# Patient Record
Sex: Male | Born: 1955 | Race: White | Hispanic: No | Marital: Married | Smoking: Never smoker
Health system: Southern US, Community
[De-identification: ages and names within clinical notes are randomized; demographics above are authoritative.]

## PROBLEM LIST (undated history)

## (undated) DIAGNOSIS — E785 Hyperlipidemia, unspecified: Secondary | ICD-10-CM

## (undated) DIAGNOSIS — I10 Essential (primary) hypertension: Secondary | ICD-10-CM

## (undated) DIAGNOSIS — F411 Generalized anxiety disorder: Secondary | ICD-10-CM

## (undated) DIAGNOSIS — G250 Essential tremor: Secondary | ICD-10-CM

## (undated) HISTORY — DX: Essential (primary) hypertension: I10

## (undated) HISTORY — DX: Essential tremor: G25.0

## (undated) HISTORY — DX: Generalized anxiety disorder: F41.1

## (undated) HISTORY — PX: BASAL CELL CARCINOMA EXCISION: SHX1214

## (undated) HISTORY — DX: Hyperlipidemia, unspecified: E78.5

---

## 2002-10-29 ENCOUNTER — Encounter: Payer: Self-pay | Admitting: Family Medicine

## 2002-10-29 ENCOUNTER — Encounter: Admission: RE | Admit: 2002-10-29 | Discharge: 2002-10-29 | Payer: Self-pay | Admitting: Family Medicine

## 2003-02-16 ENCOUNTER — Encounter: Admission: RE | Admit: 2003-02-16 | Discharge: 2003-02-16 | Payer: Self-pay | Admitting: Family Medicine

## 2003-02-16 ENCOUNTER — Encounter: Payer: Self-pay | Admitting: Family Medicine

## 2013-01-26 ENCOUNTER — Other Ambulatory Visit: Payer: Self-pay | Admitting: Internal Medicine

## 2013-01-26 ENCOUNTER — Other Ambulatory Visit: Payer: Self-pay

## 2013-01-26 DIAGNOSIS — R109 Unspecified abdominal pain: Secondary | ICD-10-CM

## 2013-01-28 ENCOUNTER — Ambulatory Visit
Admission: RE | Admit: 2013-01-28 | Discharge: 2013-01-28 | Disposition: A | Discharge status: Home / Self Care | Payer: 59 | Source: Ambulatory Visit | Attending: Internal Medicine | Admitting: Internal Medicine

## 2013-01-28 DIAGNOSIS — R109 Unspecified abdominal pain: Secondary | ICD-10-CM

## 2017-01-08 DIAGNOSIS — D126 Benign neoplasm of colon, unspecified: Secondary | ICD-10-CM | POA: Diagnosis not present

## 2017-01-08 DIAGNOSIS — K635 Polyp of colon: Secondary | ICD-10-CM | POA: Diagnosis not present

## 2017-01-08 DIAGNOSIS — Z86010 Personal history of colonic polyps: Secondary | ICD-10-CM | POA: Diagnosis not present

## 2017-04-10 DIAGNOSIS — D696 Thrombocytopenia, unspecified: Secondary | ICD-10-CM | POA: Diagnosis not present

## 2017-04-10 DIAGNOSIS — D4989 Neoplasm of unspecified behavior of other specified sites: Secondary | ICD-10-CM | POA: Diagnosis not present

## 2017-05-24 DIAGNOSIS — Z85828 Personal history of other malignant neoplasm of skin: Secondary | ICD-10-CM | POA: Diagnosis not present

## 2017-05-24 DIAGNOSIS — L821 Other seborrheic keratosis: Secondary | ICD-10-CM | POA: Diagnosis not present

## 2017-05-24 DIAGNOSIS — L812 Freckles: Secondary | ICD-10-CM | POA: Diagnosis not present

## 2017-05-24 DIAGNOSIS — L858 Other specified epidermal thickening: Secondary | ICD-10-CM | POA: Diagnosis not present

## 2017-06-24 DIAGNOSIS — T24222A Burn of second degree of left knee, initial encounter: Secondary | ICD-10-CM | POA: Diagnosis not present

## 2017-06-24 DIAGNOSIS — T22211A Burn of second degree of right forearm, initial encounter: Secondary | ICD-10-CM | POA: Diagnosis not present

## 2017-07-29 DIAGNOSIS — Z0000 Encounter for general adult medical examination without abnormal findings: Secondary | ICD-10-CM | POA: Diagnosis not present

## 2017-07-29 DIAGNOSIS — Z125 Encounter for screening for malignant neoplasm of prostate: Secondary | ICD-10-CM | POA: Diagnosis not present

## 2017-08-01 DIAGNOSIS — Z0001 Encounter for general adult medical examination with abnormal findings: Secondary | ICD-10-CM | POA: Diagnosis not present

## 2018-05-29 DIAGNOSIS — B370 Candidal stomatitis: Secondary | ICD-10-CM | POA: Diagnosis not present

## 2018-05-29 DIAGNOSIS — K120 Recurrent oral aphthae: Secondary | ICD-10-CM | POA: Diagnosis not present

## 2018-07-10 DIAGNOSIS — Z85828 Personal history of other malignant neoplasm of skin: Secondary | ICD-10-CM | POA: Diagnosis not present

## 2018-07-10 DIAGNOSIS — D225 Melanocytic nevi of trunk: Secondary | ICD-10-CM | POA: Diagnosis not present

## 2018-07-10 DIAGNOSIS — L821 Other seborrheic keratosis: Secondary | ICD-10-CM | POA: Diagnosis not present

## 2018-07-10 DIAGNOSIS — L820 Inflamed seborrheic keratosis: Secondary | ICD-10-CM | POA: Diagnosis not present

## 2018-07-31 DIAGNOSIS — Z0000 Encounter for general adult medical examination without abnormal findings: Secondary | ICD-10-CM | POA: Diagnosis not present

## 2018-07-31 DIAGNOSIS — Z125 Encounter for screening for malignant neoplasm of prostate: Secondary | ICD-10-CM | POA: Diagnosis not present

## 2018-07-31 DIAGNOSIS — K120 Recurrent oral aphthae: Secondary | ICD-10-CM | POA: Diagnosis not present

## 2018-08-04 DIAGNOSIS — Z23 Encounter for immunization: Secondary | ICD-10-CM | POA: Diagnosis not present

## 2018-08-04 DIAGNOSIS — Z0000 Encounter for general adult medical examination without abnormal findings: Secondary | ICD-10-CM | POA: Diagnosis not present

## 2021-01-31 ENCOUNTER — Other Ambulatory Visit: Payer: Self-pay | Admitting: Internal Medicine

## 2021-02-01 ENCOUNTER — Telehealth: Payer: Self-pay

## 2021-02-01 NOTE — Telephone Encounter (Signed)
NOTES ON FILE FROM [facility] [phone], SENT REFERRAL TO SCHEDULING 

## 2021-02-02 ENCOUNTER — Other Ambulatory Visit: Payer: Self-pay | Admitting: Internal Medicine

## 2021-02-02 DIAGNOSIS — R079 Chest pain, unspecified: Secondary | ICD-10-CM

## 2021-02-28 ENCOUNTER — Ambulatory Visit
Admission: RE | Admit: 2021-02-28 | Discharge: 2021-02-28 | Disposition: A | Discharge status: Home / Self Care | Payer: No Typology Code available for payment source | Source: Ambulatory Visit | Attending: Internal Medicine | Admitting: Internal Medicine

## 2021-02-28 DIAGNOSIS — R079 Chest pain, unspecified: Secondary | ICD-10-CM

## 2021-04-07 ENCOUNTER — Ambulatory Visit: Payer: Self-pay | Admitting: Cardiology

## 2021-04-19 ENCOUNTER — Ambulatory Visit: Payer: 59 | Admitting: Podiatry

## 2021-04-19 ENCOUNTER — Other Ambulatory Visit: Payer: Self-pay

## 2021-04-19 DIAGNOSIS — M79675 Pain in left toe(s): Secondary | ICD-10-CM

## 2021-04-19 DIAGNOSIS — B351 Tinea unguium: Secondary | ICD-10-CM

## 2021-04-19 DIAGNOSIS — M2142 Flat foot [pes planus] (acquired), left foot: Secondary | ICD-10-CM

## 2021-04-19 DIAGNOSIS — M79674 Pain in right toe(s): Secondary | ICD-10-CM

## 2021-04-19 DIAGNOSIS — M2141 Flat foot [pes planus] (acquired), right foot: Secondary | ICD-10-CM | POA: Diagnosis not present

## 2021-04-19 MED ORDER — TERBINAFINE HCL 250 MG PO TABS: 250.0000 mg | ORAL_TABLET | Freq: Every day | ORAL | 0 refills | Status: DC

## 2021-04-19 NOTE — Progress Notes (Signed)
   SUBJECTIVE Patient presents to office today complaining of elongated, thickened nails that cause pain while ambulating in shoes.  Patient is unable to trim their own nails.   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  He states that he has worn arch supports off and on for his entire life.  He states that he has very narrow heels and flatfeet which makes shoes difficult.  Patient is here for further evaluation and treatment.  No past medical history on file.  OBJECTIVE General Patient is awake, alert, and oriented x 3 and in no acute distress. Derm Skin is dry and supple bilateral. Negative open lesions or macerations. Remaining integument unremarkable. Nails are tender, long, thickened and dystrophic with subungual debris, consistent with onychomycosis, 1-5 bilateral. No signs of infection noted. Vasc  DP and PT pedal pulses palpable bilaterally. Temperature gradient within normal limits.  Neuro Epicritic and protective threshold sensation grossly intact bilaterally.  Musculoskeletal Exam No symptomatic pedal deformities noted bilateral. Muscular strength within normal limits.  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  Pain due to onychomycosis of toenails both 2.  Pes planus bilateral  PLAN OF CARE 1. Patient evaluated today.  2. Instructed to maintain good pedal hygiene and foot care.  3. Mechanical debridement of nails 1-5 bilaterally performed using a nail nipper. Filed with dremel without incident.  4.  Today we discussed different treatment options including oral, topical, and laser antifungal treatment modalities for onychomycosis of the toenails.  The patient's sister took Lamisil with really good results.  He would like to proceed with oral Lamisil.  He denies a history of liver pathology or symptoms  5.  Prescription for Lamisil 2 and 50 mg #90 daily  6.  Regarding the chronic flatfeet  and pes planus, recommend custom molded orthotics.  Patient is going to check with his insurance to see if they cover orthotics.  If they do he will call and to the office for an appointment with our Pedorthist  7.  Return to clinic as needed   Edrick Kins, DPM [facility]  Dr. Edrick Kins, DPM    [address], [place] [address]                Office [phone]  Fax [phone]

## 2021-05-26 NOTE — Telephone Encounter (Signed)
Pt is scheduled to see Dr. Percival Spanish on 06/20/21. Notes have been faxed over to the [facility] office. Fax confirmation received.

## 2021-06-18 DIAGNOSIS — R079 Chest pain, unspecified: Secondary | ICD-10-CM | POA: Insufficient documentation

## 2021-06-18 DIAGNOSIS — R072 Precordial pain: Secondary | ICD-10-CM | POA: Insufficient documentation

## 2021-06-18 DIAGNOSIS — R931 Abnormal findings on diagnostic imaging of heart and coronary circulation: Secondary | ICD-10-CM | POA: Insufficient documentation

## 2021-06-18 NOTE — Progress Notes (Signed)
Cardiology Office Note   Date:  06/20/2021   ID:  Noah Bradley, DOB [birth year], MRN [id number]  PCP:  Deland Pretty, MD  Cardiologist:   None Referring:  Deland Pretty, MD  Chief Complaint  Patient presents with   Elevated Coronary Calcium       History of Present Illness: Noah Bradley is a 65 y.o. male who presents for evaluation of exertional chest pain.    He is referred by Deland Pretty, MD  He had a coronary calcium score of 513 in May.  This was 85%.  This followed an episode of chest discomfort.  This happened in early May.  He was 2 out of 10 in intensity.  It lasted for about 12 hours.  He felt like a muscle pull and he has had this in his neck before.  There were no associated symptoms such as nausea vomiting or diaphoresis.  He was not short of breath.  He had no PND or orthopnea.  He did not have any palpitations, presyncope or syncope.  He was finally convinced to get a calcium score.  However, since that event he is absolutely had no symptoms other than some very mild shortness of breath with significant physical activity.  He does not have any chest pressure, neck or arm discomfort.  He does yard work.  He goes to the gym and gets on the elliptical.   Past Medical History:  Diagnosis Date   Essential tremor    GAD (generalized anxiety disorder)    HTN (hypertension)    Hyperlipemia     Past Surgical History:  Procedure Laterality Date   BASAL CELL CARCINOMA EXCISION       Current Outpatient Medications  Medication Sig Dispense Refill   buPROPion (WELLBUTRIN XL) 300 MG 24 hr tablet Take 300 mg by mouth daily.     cetirizine (ZYRTEC) 10 MG tablet 1 tablet     clonazePAM (KLONOPIN) 0.5 MG tablet Take 0.75 mg by mouth 2 (two) times daily.     esomeprazole (NEXIUM) 40 MG capsule 1 capsule     fluticasone (FLONASE) 50 MCG/ACT nasal spray 1 spray in each nostril     IBU 600 MG tablet Take 600 mg by mouth 3 (three) times daily as needed.     sertraline  (ZOLOFT) 100 MG tablet Take 100 mg by mouth daily.     tadalafil (CIALIS) 20 MG tablet 1 tablet     telmisartan-hydrochlorothiazide (MICARDIS HCT) 40-12.5 MG tablet Take 1 tablet by mouth daily.     terbinafine (LAMISIL) 250 MG tablet Take 1 tablet (250 mg total) by mouth daily. 90 tablet 0   traZODone (DESYREL) 100 MG tablet 0.5 to 1 tablet at bedtime as needed     No current facility-administered medications for this visit.    Allergies:   Patient has no allergy information on record.    Social History:  The patient  reports that he has never smoked. He has never used smokeless tobacco.   Family History:  The patient's family history includes Cancer in his father; Lung cancer in his mother.    ROS:  Please see the history of present illness.   Otherwise, review of systems are positive for none.   All other systems are reviewed and negative.    PHYSICAL EXAM: VS:  BP 128/64   Pulse 62   Ht '6\' 4"'$  (1.93 m)   Wt 257 lb (116.6 kg)   SpO2 95%   BMI  31.28 kg/m  , BMI Body mass index is 31.28 kg/m. GENERAL:  Well appearing HEENT:  Pupils equal round and reactive, fundi not visualized, oral mucosa unremarkable NECK:  No jugular venous distention, waveform within normal limits, carotid upstroke brisk and symmetric, no bruits, no thyromegaly LYMPHATICS:  No cervical, inguinal adenopathy LUNGS:  Clear to auscultation bilaterally BACK:  No CVA tenderness CHEST:  Unremarkable HEART:  PMI not displaced or sustained,S1 and S2 within normal limits, no S3, no S4, no clicks, no rubs, no murmurs ABD:  Flat, positive bowel sounds normal in frequency in pitch, no bruits, no rebound, no guarding, no midline pulsatile mass, no hepatomegaly, no splenomegaly EXT:  2 plus pulses throughout, no edema, no cyanosis no clubbing SKIN:  No rashes no nodules NEURO:  Cranial nerves II through XII grossly intact, motor grossly intact throughout, resting tremor PSYCH:  Cognitively intact, oriented to person  place and time    EKG:  EKG is ordered today. The ekg ordered today demonstrates sinus rhythm, rate 62, axis within normal, intervals within normal limits, no acute ST-T wave changes.   Recent Labs: No results found for requested labs within last 8760 hours.    Lipid Panel No results found for: CHOL, TRIG, HDL, CHOLHDL, VLDL, LDLCALC, LDLDIRECT    Wt Readings from Last 3 Encounters:  06/20/21 257 lb (116.6 kg)      Other studies Reviewed: Additional studies/ records that were reviewed today include: Labs. Review of the above records demonstrates:  Please see elsewhere in the note.     ASSESSMENT AND PLAN:  ELEVATED CORONARY CALCIUM: He is MESA score is 18.  We had a long conversation about this.  I agree his statin is indicated.  He has some shortness of breath but no other symptoms.  I will bring the patient back for a POET (Plain Old Exercise Test). This will allow me to screen for obstructive coronary disease, risk stratify and very importantly provide a prescription for exercise.   DYSLIPIDEMIA: We had a long discussion about this.  He is going to go home and watch and [employer] fast streamer.  I have suggested a plant-based Mediterranean diet.  As above I agree that a statin is indicated but he probably wants to try diet first.  He does need to get a repeat lipid profile in no longer than 6 months with a goal LDL in my mind less than 70 and HDL greater than 50.  Current medicines are reviewed at length with the patient today.  The patient does not have concerns regarding medicines.  The following changes have been made:  no change  Labs/ tests ordered today include:   Orders Placed This Encounter  Procedures   Cardiac Stress Test: Informed Consent Details: Physician/Practitioner Attestation; Transcribe to consent form and obtain patient signature   EXERCISE TOLERANCE TEST (ETT)   EKG 12-Lead      Disposition:   FU with me in one year.     Signed, Minus Breeding, MD  06/20/2021 3:28 PM    [facility]

## 2021-06-20 ENCOUNTER — Encounter: Payer: Self-pay | Admitting: Cardiology

## 2021-06-20 ENCOUNTER — Ambulatory Visit: Payer: 59 | Admitting: Cardiology

## 2021-06-20 ENCOUNTER — Other Ambulatory Visit: Payer: Self-pay

## 2021-06-20 VITALS — BP 128/64 | HR 62 | Ht 76.0 in | Wt 257.0 lb

## 2021-06-20 DIAGNOSIS — R931 Abnormal findings on diagnostic imaging of heart and coronary circulation: Secondary | ICD-10-CM | POA: Diagnosis not present

## 2021-06-20 DIAGNOSIS — R0602 Shortness of breath: Secondary | ICD-10-CM | POA: Insufficient documentation

## 2021-06-20 DIAGNOSIS — R072 Precordial pain: Secondary | ICD-10-CM

## 2021-06-20 NOTE — Patient Instructions (Signed)
Medication Instructions:  Your physician recommends that you continue on your current medications as directed. Please refer to the Current Medication list given to you today.  Testing/Procedures: Your physician has requested that you have an exercise tolerance test. For further information please visit [email or web address]. Please also follow instruction sheet, as given.  Follow-Up: At [facility], you and your health needs are our priority.  As part of our continuing mission to provide you with exceptional heart care, we have created designated Provider Care Teams.  These Care Teams include your primary Cardiologist (physician) and Advanced Practice Providers (APPs -  Physician Assistants and Nurse Practitioners) who all work together to provide you with the care you need, when you need it.  We recommend signing up for the patient portal called "MyChart".  Sign up information is provided on this After Visit Summary.  MyChart is used to connect with patients for Virtual Visits (Telemedicine).  Patients are able to view lab/test results, encounter notes, upcoming appointments, etc.  Non-urgent messages can be sent to your provider as well.   To learn more about what you can do with MyChart, go to [email or web address].    Your next appointment:   12 month(s)  The format for your next appointment:   In Person  Provider:   Minus Breeding, MD   Other Instructions Game Changer-plant based diet

## 2021-07-05 ENCOUNTER — Telehealth (HOSPITAL_COMMUNITY): Payer: Self-pay | Admitting: *Deleted

## 2021-07-05 NOTE — Telephone Encounter (Signed)
Close encounter 

## 2021-07-06 ENCOUNTER — Ambulatory Visit (HOSPITAL_COMMUNITY)
Admission: RE | Admit: 2021-07-06 | Discharge: 2021-07-06 | Disposition: A | Discharge status: Home / Self Care | Payer: 59 | Source: Ambulatory Visit | Attending: Cardiology | Admitting: Cardiology

## 2021-07-06 ENCOUNTER — Other Ambulatory Visit: Payer: Self-pay

## 2021-07-06 DIAGNOSIS — R0602 Shortness of breath: Secondary | ICD-10-CM | POA: Insufficient documentation

## 2021-07-06 DIAGNOSIS — R931 Abnormal findings on diagnostic imaging of heart and coronary circulation: Secondary | ICD-10-CM | POA: Diagnosis present

## 2021-07-06 LAB — EXERCISE TOLERANCE TEST
Angina Index: 0
Base ST Depression (mm): 0 mm
Duke Treadmill Score: 3
Estimated workload: 10.1
Exercise duration (min): 8 min
Exercise duration (sec): 11 s
MPHR: 141 {beats}/min
Peak HR: 141 {beats}/min
Percent HR: 90 %
Rest HR: 72 {beats}/min
ST Depression (mm): 1 mm

## 2021-07-13 ENCOUNTER — Encounter: Payer: Self-pay | Admitting: *Deleted

## 2021-07-13 NOTE — Telephone Encounter (Addendum)
-----   Message from Minus Breeding, MD sent at 07/09/2021 10:45 AM EDT ----- He had hypertensive response to exercise.  In addition he had some ST changes which could indicate that some of the blockages suggested by a calcium in his vessels might be obstructive.  Given this the next step should be coronary CTA.  Please arrange and schedule follow-up after this testing.  Call   [place] with the results and send results to Deland Pretty, MD  Left message for pt to call

## 2021-07-17 NOTE — Telephone Encounter (Signed)
Left message for pt to call.

## 2021-07-18 ENCOUNTER — Encounter: Payer: Self-pay | Admitting: *Deleted

## 2021-07-18 ENCOUNTER — Telehealth: Payer: Self-pay | Admitting: *Deleted

## 2021-07-18 DIAGNOSIS — R9431 Abnormal electrocardiogram [ECG] [EKG]: Secondary | ICD-10-CM

## 2021-07-18 MED ORDER — METOPROLOL TARTRATE 100 MG PO TABS: ORAL_TABLET | ORAL | 0 refills | Status: DC

## 2021-07-18 NOTE — Telephone Encounter (Signed)
This encounter was created in error - please disregard.

## 2021-07-18 NOTE — Telephone Encounter (Signed)
-----   Message from Minus Breeding, MD sent at 07/09/2021 10:45 AM EDT ----- He had hypertensive response to exercise.  In addition he had some ST changes which could indicate that some of the blockages suggested by a calcium in his vessels might be obstructive.  Given this the next step should be coronary CTA.  Please arrange and schedule follow-up after this testing.  Call   [place]ner with the results and send results to Deland Pretty, MD

## 2021-07-18 NOTE — Telephone Encounter (Signed)
Spoke with pt, aware of results. Order placed for CTA. Lab orders mailed to the pt. Instructions sent to patient via my chart. Left message for pt to call with allergies.

## 2021-07-19 ENCOUNTER — Encounter: Payer: Self-pay | Admitting: *Deleted

## 2021-07-21 LAB — BASIC METABOLIC PANEL
BUN/Creatinine Ratio: 23 (ref 10–24)
BUN: 25 mg/dL (ref 8–27)
CO2: 26 mmol/L (ref 20–29)
Calcium: 9.1 mg/dL (ref 8.6–10.2)
Chloride: 104 mmol/L (ref 96–106)
Creatinine, Ser: 1.1 mg/dL (ref 0.76–1.27)
Glucose: 116 mg/dL — ABNORMAL HIGH (ref 70–99)
Potassium: 4.3 mmol/L (ref 3.5–5.2)
Sodium: 142 mmol/L (ref 134–144)
eGFR: 75 mL/min/{1.73_m2} (ref >59)

## 2021-07-25 ENCOUNTER — Telehealth (HOSPITAL_COMMUNITY): Payer: Self-pay | Admitting: Emergency Medicine

## 2021-07-25 ENCOUNTER — Telehealth (HOSPITAL_COMMUNITY): Payer: Self-pay | Admitting: *Deleted

## 2021-07-25 NOTE — Telephone Encounter (Signed)
Attempted to call patient regarding upcoming cardiac CT appointment. °Left message on voicemail with name and callback number °Silvanna Ohmer RN Navigator Cardiac Imaging °[facility] °[phone] Office °[phone] Cell ° °

## 2021-07-25 NOTE — Telephone Encounter (Signed)
Reaching out to patient to offer assistance regarding upcoming cardiac imaging study; pt verbalizes understanding of appt date/time, parking situation and where to check in, pre-test NPO status and medications ordered, and verified current allergies; name and call back number provided for further questions should they arise  Gordy Clement RN Navigator Cardiac [facility] [phone] office [phone] cell  Patient to take 50mg  metoprolol tartrate if HR is greater than 65bpm.  Patient aware he can take his Klonopin for test but will need a driver if he takes the [place].

## 2021-07-26 ENCOUNTER — Other Ambulatory Visit: Payer: Self-pay

## 2021-07-26 ENCOUNTER — Ambulatory Visit (HOSPITAL_COMMUNITY)
Admission: RE | Admit: 2021-07-26 | Discharge: 2021-07-26 | Disposition: A | Discharge status: Home / Self Care | Payer: 59 | Source: Ambulatory Visit | Attending: Cardiology | Admitting: Cardiology

## 2021-07-26 DIAGNOSIS — R9431 Abnormal electrocardiogram [ECG] [EKG]: Secondary | ICD-10-CM | POA: Diagnosis present

## 2021-07-26 DIAGNOSIS — R931 Abnormal findings on diagnostic imaging of heart and coronary circulation: Secondary | ICD-10-CM | POA: Diagnosis not present

## 2021-07-26 DIAGNOSIS — I2510 Atherosclerotic heart disease of native coronary artery without angina pectoris: Secondary | ICD-10-CM | POA: Diagnosis not present

## 2021-07-26 IMAGING — CT CT HEART MORP W/ CTA COR W/ SCORE W/ CA W/CM &/OR W/O CM
4 of 7 series · 8 of 20 positions shown, 9 images · IV contrast (APPLIED)
Comparison: 02/28/2021
COMPARISON: 02/28/2021

Addendum:
EXAM:
OVER-READ INTERPRETATION  CT CHEST

The following report is an over-read performed by radiologist Dr.
Abdullahel View [REDACTED] on 07/26/2021. This
over-read does not include interpretation of cardiac or coronary
anatomy or pathology. The coronary CTA interpretation by the
cardiologist is attached.
HISTORY: abnormal GXT
Cardiac/Coronary CT
TECHNIQUE: The patient was scanned on a Siemens Force scanner.
PROTOCOL: A 120 kV prospective scan was triggered in the descending thoracic
aorta at 111 HU's. Axial non-contrast 3 mm slices were carried out
through the heart. The data set was analyzed on a dedicated work
station and scored using the Agatson method. Gantry rotation speed
was 250 msecs and collimation was 0.6 mm. Heart rate optimized
medically, and 0.8 mg of sublingual nitroglycerin was given. The 3D
data set was reconstructed in 5% intervals of 35-75% of the R-R
cycle. Diastolic phases were analyzed on a dedicated work station
using MPR, MIP and VRT modes. The patient received 95mL OMNIPAQUE
IOHEXOL 350 MG/ML SOLN of contrast.

[Series 6: ts diast sharp · axial · 0.39mm/px · z∈[-99,-62]mm · 2 of 281 slices shown]
[im 94/281  lung]
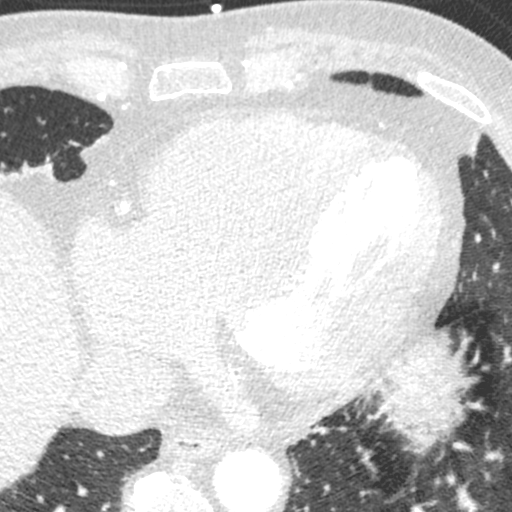
[im 187/281  lung]
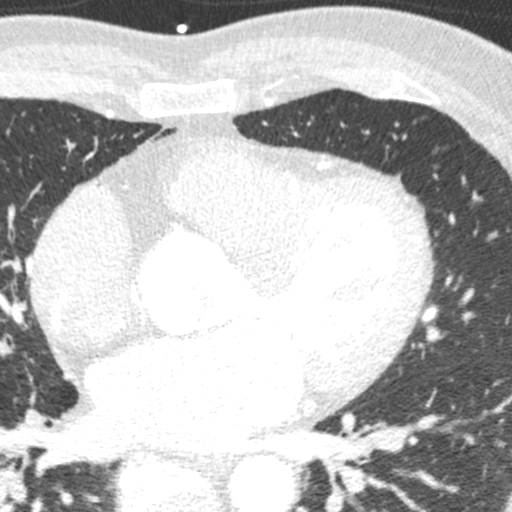

[Series 7: best diast · axial · 0.39mm/px · z∈[-99,-62]mm · 2 of 281 slices shown, 3 images]
[im 94/281  vessel]
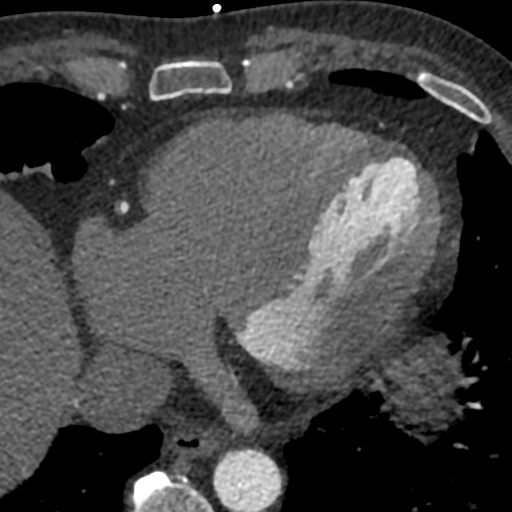
[im 94/281  lung]
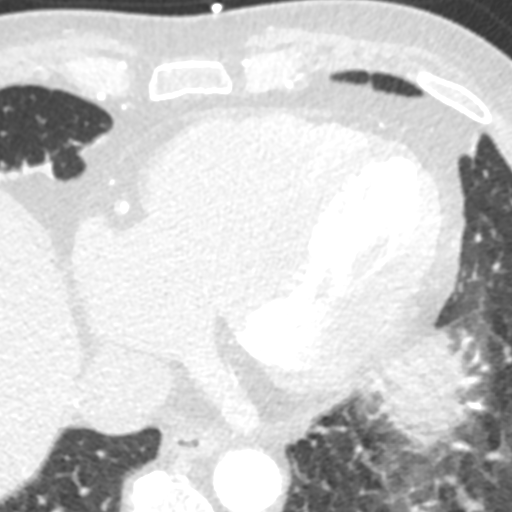
[im 187/281  vessel]
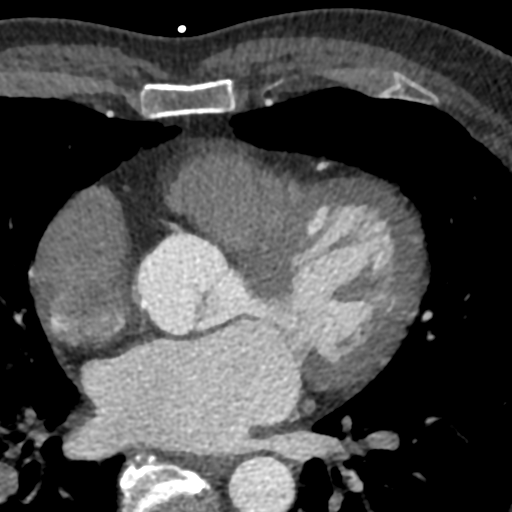

[Series 8: best syst · axial · 0.39mm/px · z∈[-99,-62]mm · 2 of 281 slices shown]
[im 94/281  vessel]
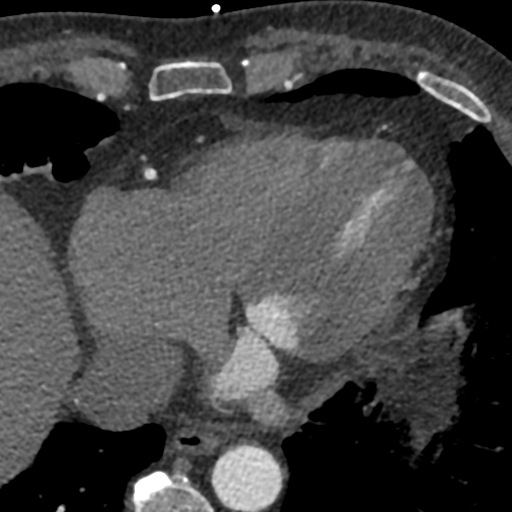
[im 187/281  vessel]
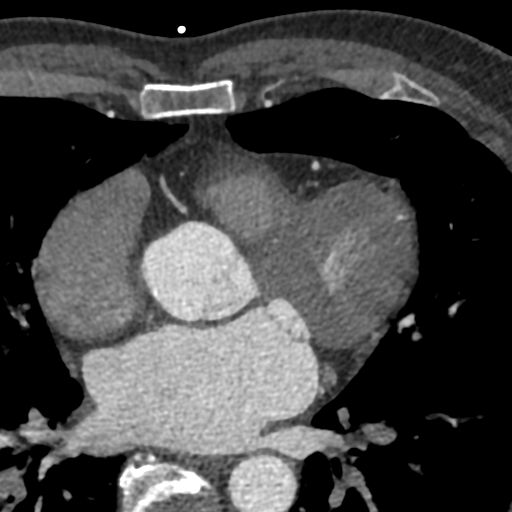

[Series 9: ts syst sharp · axial · 0.39mm/px · z∈[-99,-62]mm · 2 of 281 slices shown]
[im 94/281  lung]
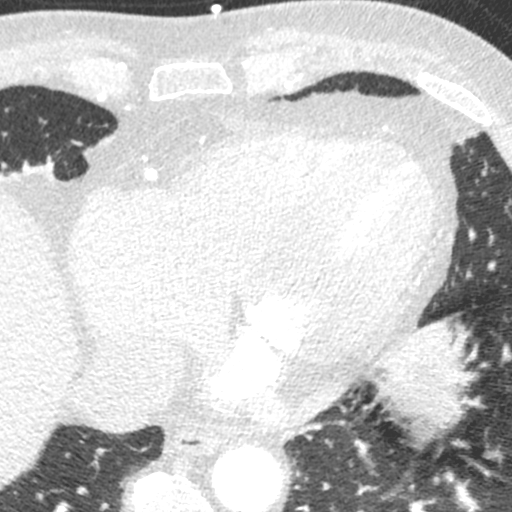
[im 187/281  lung]
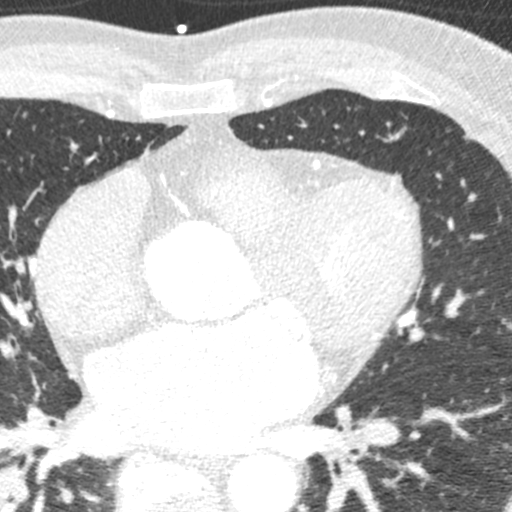

[8 of 20 positions shown; findings below may reference images not displayed]

FINDINGS: Vascular: Heart is normal size.  Aorta normal caliber.

Mediastinum/Nodes: No adenopathy

Lungs/Pleura: No confluent opacities or effusions. Linear scarring
at the lung bases.

Upper Abdomen: Imaging into the upper abdomen demonstrates no acute
findings.

Musculoskeletal: Chest wall soft tissues are unremarkable. No acute
bony abnormality.
IMPRESSION: No acute or significant extracardiac abnormality.
FINDINGS: Coronary calcium score: The patient's coronary artery calcium score
is 212, which places the patient in the 70th percentile.

Coronary arteries: Normal coronary origins.  Right dominance.

Right Coronary Artery: Normal caliber vessel, gives rise to PDA.
There is focal mixed calcified and noncalcified plaque in the
proximal RCA with 25-49% stenosis. There appears to be protrusion of
plaque into the lumen with flow around it. There is noncalcified
plaque throughout the remainder of the proximal RCA with 1-24%
stenosis. There is mixed calcified and noncalcified plaque in the
mid RCA with maximum 1-24% stenosis. There is focal mixed calcified
and noncalcified plaque in the distal LAD with 25-49% stenosis.
There is another focal noncalcified plaque in the distal RCA with
1-24% stenosis but protruding plaque into the lumen. There is also a
focal mixed calcified and noncalcified plaque just before the origin
of the PDA. This is a complex plaque with central hypoattenuation
and minimal flow, concerning for stenosis 70-99%. There does appear
to be flow in the PDA but cannot determine if this is anterograde or
from collaterals.

Left Main Coronary Artery: Normal caliber vessel. Small amount of
noncalcified plaque with <10% stenosis.

Left Anterior Descending Coronary Artery: Normal caliber vessel.
There is focal mixed calcified and noncalcified plaque in the
proximal LAD with approximately 50% stenosis. This plaque has high
risk features including spotty calcification and low attenuation
plaque. Plaque extends 19 mm. There is a separate mixed calcified
and noncalcified plaque in the mid LAD with 25-49% stenosis near the
origin of D2. Gives rise to 2 diagonal branches.

Left Circumflex Artery: Normal caliber vessel. Gives rise to medium
OM branch that further bifurcates. At proximal portion of OM, there
is a mixed calcified and noncalcified plaque with visually >70%
stenosis.

Aorta: Normal size, 37 mm at the mid ascending aorta (level of the
PA bifurcation) measured double oblique. Scattered calcifications
consistent with aortic atherosclerosis. No dissection seen in
visualized portions of the aorta.

Aortic Valve: Trivial calcifications. Trileaflet.

Other findings:

Normal pulmonary vein drainage into the left atrium.

Normal left atrial appendage without a thrombus.

Normal size of the pulmonary artery.

Normal appearance of the pericardium.
IMPRESSION: 1. Severe obstructive CAD, CADRADS = 4. CT FFR will be performed and
reported separately. Concern for obstructive disease in LAD and
especially in LCx/OM and distal RCA/PDA.

2. Coronary calcium score of 212. This was 70th percentile for age
and sex matched control.

3. Normal coronary origin with right dominance.

INTERPRETATION:

1. CAD-RADS 0: No evidence of CAD (0%). Consider non-atherosclerotic
causes of chest pain.

2. CAD-RADS 1: Minimal non-obstructive CAD (0-24%). Consider
non-atherosclerotic causes of chest pain. Consider preventive
therapy and risk factor modification.

3. CAD-RADS 2: Mild non-obstructive CAD (25-49%). Consider
non-atherosclerotic causes of chest pain. Consider preventive
therapy and risk factor modification.

4. CAD-RADS 3: Moderate stenosis (50-69%). Consider symptom-guided
anti-ischemic pharmacotherapy as well as risk factor modification
per guideline directed care. Additional analysis with CT FFR will be
submitted.

5. CAD-RADS 4: Severe stenosis. (70-99% or > 50% left main). Cardiac
catheterization or CT FFR is recommended. Consider symptom-guided
anti-ischemic pharmacotherapy as well as risk factor modification
per guideline directed care. Invasive coronary angiography
recommended with revascularization per published guideline
statements.

6. CAD-RADS 5: Total coronary occlusion (100%). Consider cardiac
catheterization or viability assessment. Consider symptom-guided
anti-ischemic pharmacotherapy as well as risk factor modification
per guideline directed care.

7. CAD-RADS N: Non-diagnostic study. Obstructive CAD can't be
excluded. Alternative evaluation is recommended.

*** End of Addendum ***
EXAM:
OVER-READ INTERPRETATION  CT CHEST

The following report is an over-read performed by radiologist Dr.
Abdullahel View [REDACTED] on 07/26/2021. This
over-read does not include interpretation of cardiac or coronary
anatomy or pathology. The coronary CTA interpretation by the
cardiologist is attached.
FINDINGS: Vascular: Heart is normal size.  Aorta normal caliber.

Mediastinum/Nodes: No adenopathy

Lungs/Pleura: No confluent opacities or effusions. Linear scarring
at the lung bases.

Upper Abdomen: Imaging into the upper abdomen demonstrates no acute
findings.

Musculoskeletal: Chest wall soft tissues are unremarkable. No acute
bony abnormality.
IMPRESSION: No acute or significant extracardiac abnormality.

## 2021-07-26 MED ORDER — NITROGLYCERIN 0.4 MG SL SUBL
0.8000 mg | SUBLINGUAL_TABLET | Freq: Once | SUBLINGUAL | Status: AC
  Administered 2021-07-26: 0.8 mg via SUBLINGUAL

## 2021-07-26 MED ORDER — IOHEXOL 350 MG/ML SOLN
95.0000 mL | Freq: Once | INTRAVENOUS | Status: AC | PRN
  Administered 2021-07-26: 95 mL via INTRAVENOUS

## 2021-07-26 MED ORDER — NITROGLYCERIN 0.4 MG SL SUBL
SUBLINGUAL_TABLET | SUBLINGUAL | Status: AC
  Filled 2021-07-26: qty 2

## 2021-07-27 ENCOUNTER — Ambulatory Visit (HOSPITAL_COMMUNITY)
Admission: RE | Admit: 2021-07-27 | Discharge: 2021-07-27 | Disposition: A | Discharge status: Home / Self Care | Payer: 59 | Source: Ambulatory Visit | Attending: Cardiology | Admitting: Cardiology

## 2021-07-27 ENCOUNTER — Other Ambulatory Visit (HOSPITAL_BASED_OUTPATIENT_CLINIC_OR_DEPARTMENT_OTHER): Payer: Self-pay | Admitting: Cardiology

## 2021-07-27 DIAGNOSIS — R072 Precordial pain: Secondary | ICD-10-CM | POA: Diagnosis present

## 2021-07-27 DIAGNOSIS — I2510 Atherosclerotic heart disease of native coronary artery without angina pectoris: Secondary | ICD-10-CM | POA: Diagnosis not present

## 2021-07-27 DIAGNOSIS — R931 Abnormal findings on diagnostic imaging of heart and coronary circulation: Secondary | ICD-10-CM | POA: Diagnosis not present

## 2021-07-27 NOTE — Progress Notes (Signed)
Here is the [facility] for your patient. Positive in OM and suggests severe stenosis/occlusion in the distal RCA/PDA transition

## 2021-08-01 ENCOUNTER — Other Ambulatory Visit: Payer: Self-pay | Admitting: *Deleted

## 2021-08-01 DIAGNOSIS — R072 Precordial pain: Secondary | ICD-10-CM

## 2021-08-01 MED ORDER — ASPIRIN EC 81 MG PO TBEC: 81.0000 mg | DELAYED_RELEASE_TABLET | Freq: Every day | ORAL | 3 refills | Status: AC

## 2021-08-01 MED ORDER — CLOPIDOGREL BISULFATE 75 MG PO TABS: 75.0000 mg | ORAL_TABLET | Freq: Every day | ORAL | 3 refills | Status: DC

## 2021-08-01 MED ORDER — SODIUM CHLORIDE 0.9% FLUSH: 3.0000 mL | Freq: Two times a day (BID) | INTRAVENOUS | Status: DC

## 2021-08-01 NOTE — Progress Notes (Signed)
Cardiology Office Note   Date:  08/02/2021   ID:  [name], DOB 1956-05-07, MRN [id number]  PCP:  Deland Pretty, MD  Cardiologist:   None Referring:  Deland Pretty, MD  Chief Complaint  Patient presents with   Chest Pain     History of Present Illness: Noah Bradley is a 65 y.o. adult who presents for evaluation of exertional chest pain.  He had a calcium score of 513 in May.   POET (Plain Old Exercise Treadmill) demonstrated hypertensive response to exercise with nonspecific ST changes.  Coronary CT demonstrated proximal LAD 50% stenosis with some possible high risk features.  An OM branch had greater than 70% stenosis.  Right coronary artery had a complex plaque of 70 to 99% at the origin of the PDA.  FFR suggested that the OM and RCA stenosis were most significant.  Duration.  He has not had any new discomfort.  He previously been having some sensation in his chest like a pulled muscle.  This was 2 out of 10 intensity.  It was at rest.  He has not been having any new shortness of breath, PND or orthopnea.  Not having any new palpitations, presyncope or syncope.   Past Medical History:  Diagnosis Date   Essential tremor    GAD (generalized anxiety disorder)    HTN (hypertension)    Hyperlipemia     Past Surgical History:  Procedure Laterality Date   BASAL CELL CARCINOMA EXCISION     Social History   Socioeconomic History   Marital status: Married    Spouse name: Not on file   Number of children: Not on file   Years of education: Not on file   Highest education level: Not on file  Occupational History   Not on file  Tobacco Use   Smoking status: Never   Smokeless tobacco: Never  Substance and Sexual Activity   Alcohol use: Not on file   Drug use: Not on file   Sexual activity: Not on file  Other Topics Concern   Not on file  Social History Narrative   Not on file   Social Determinants of Health   Financial Resource Strain: Not on file  Food  Insecurity: Not on file  Transportation Needs: Not on file  Physical Activity: Not on file  Stress: Not on file  Social Connections: Not on file     Family History  Problem Relation Age of Onset   Lung cancer Mother    Cancer Father       Current Outpatient Medications  Medication Sig Dispense Refill   aspirin EC 81 MG tablet Take 1 tablet (81 mg total) by mouth daily. Swallow whole. 90 tablet 3   buPROPion (WELLBUTRIN XL) 300 MG 24 hr tablet Take 300 mg by mouth daily.     cetirizine (ZYRTEC) 10 MG tablet 1 tablet     clonazePAM (KLONOPIN) 0.5 MG tablet Take 0.75 mg by mouth 2 (two) times daily.     clopidogrel (PLAVIX) 75 MG tablet Take 1 tablet (75 mg total) by mouth daily. 90 tablet 3   esomeprazole (NEXIUM) 40 MG capsule 1 capsule     fluticasone (FLONASE) 50 MCG/ACT nasal spray 1 spray in each nostril     IBU 600 MG tablet Take 600 mg by mouth 3 (three) times daily as needed.     metoprolol tartrate (LOPRESSOR) 100 MG tablet Take 2 hours prior to CT scan 1 tablet 0  rosuvastatin (CRESTOR) 20 MG tablet Take 20 mg by mouth daily.     sertraline (ZOLOFT) 100 MG tablet Take 100 mg by mouth daily.     tadalafil (CIALIS) 20 MG tablet 1 tablet     telmisartan-hydrochlorothiazide (MICARDIS HCT) 40-12.5 MG tablet Take 1 tablet by mouth daily.     terbinafine (LAMISIL) 250 MG tablet Take 1 tablet (250 mg total) by mouth daily. 90 tablet 0   traZODone (DESYREL) 100 MG tablet 0.5 to 1 tablet at bedtime as needed     triamcinolone (KENALOG) 0.1 % paste SMARTSIG:TO TEETH PRN     Current Facility-Administered Medications  Medication Dose Route Frequency Provider Last Rate Last Admin   sodium chloride flush (NS) 0.9 % injection 3 mL  3 mL Intravenous Q12H Crenshaw, Denice Bors, MD        Allergies:   Patient has no known allergies.   ROS:  Please see the history of present illness.   Otherwise, review of systems are positive for none.   All other systems are reviewed and negative.     PHYSICAL EXAM: VS:  BP 108/78   Pulse 63   Ht 6\' 5"  (1.956 m)   Wt 255 lb 6.4 oz (115.8 kg)   SpO2 97%   BMI 30.29 kg/m  , BMI Body mass index is 30.29 kg/m. GENERAL:  Well appearing NECK:  No jugular venous distention, waveform within normal limits, carotid upstroke brisk and symmetric, no bruits, no thyromegaly LUNGS:  Clear to auscultation bilaterally CHEST:  Unremarkable HEART:  PMI not displaced or sustained,S1 and S2 within normal limits, no S3, no S4, no clicks, no rubs, no murmurs ABD:  Flat, positive bowel sounds normal in frequency in pitch, no bruits, no rebound, no guarding, no midline pulsatile mass, no hepatomegaly, no splenomegaly EXT:  2 plus pulses throughout, no edema, no cyanosis no clubbing   EKG:  EKG is  ordered today. The ekg ordered today demonstrates sinus rhythm, rate 63, axis within normal, intervals within normal limits, no acute ST-T wave changes.   Recent Labs: 07/20/2021: BUN 25; Creatinine, Ser 1.10; Potassium 4.3; Sodium 142    Lipid Panel No results found for: CHOL, TRIG, HDL, CHOLHDL, VLDL, LDLCALC, LDLDIRECT    Wt Readings from Last 3 Encounters:  08/02/21 255 lb 6.4 oz (115.8 kg)  06/20/21 257 lb (116.6 kg)      Other studies Reviewed: Additional studies/ records that were reviewed today include: CT. Review of the above records demonstrates:  Please see elsewhere in the note.     ASSESSMENT AND PLAN:  CHEST PAIN/OBSTRUCTIVE CAD:   He has chest pain, shortness of breath and obstructive lesions on his coronary CT.   This would be high risk including his LAD lesion  Cardiac cath is indicated.  The patient understands that risks included but are not limited to stroke (1 in 1000), death (1 in 44), kidney failure [usually temporary] (1 in 500), bleeding (1 in 200), allergic reaction [possibly serious] (1 in 200).  The patient understands and agrees to proceed.  He has been treated with Plavix which she will pick up today at the  pharmacy.  DYSLIPIDEMIA:   He is on statin.  Goal LDL will be less than 70.    Current medicines are reviewed at length with the patient today.  The patient does not have concerns regarding medicines.  The following changes have been made:  None  Labs/ tests ordered today include:   Orders Placed This Encounter  Procedures   Basic metabolic panel   CBC   EKG 12-Lead       Disposition:   FU with me or APP after the cath.    Signed, Minus Breeding, MD  08/02/2021 5:31 PM    [place]

## 2021-08-01 NOTE — H&P (View-Only) (Signed)
Cardiology Office Note   Date:  08/02/2021   ID:  Noah Bradley, DOB 1956/02/22, MRN [id number]  PCP:  Deland Pretty, MD  Cardiologist:   None Referring:  Deland Pretty, MD  Chief Complaint  Patient presents with   Chest Pain     History of Present Illness: Noah Bradley is a 65 y.o. adult who presents for evaluation of exertional chest pain.  He had a calcium score of 513 in May.   POET (Plain Old Exercise Treadmill) demonstrated hypertensive response to exercise with nonspecific ST changes.  Coronary CT demonstrated proximal LAD 50% stenosis with some possible high risk features.  An OM branch had greater than 70% stenosis.  Right coronary artery had a complex plaque of 70 to 99% at the origin of the PDA.  FFR suggested that the OM and RCA stenosis were most significant.  Duration.  He has not had any new discomfort.  He previously been having some sensation in his chest like a pulled muscle.  This was 2 out of 10 intensity.  It was at rest.  He has not been having any new shortness of breath, PND or orthopnea.  Not having any new palpitations, presyncope or syncope.   Past Medical History:  Diagnosis Date   Essential tremor    GAD (generalized anxiety disorder)    HTN (hypertension)    Hyperlipemia     Past Surgical History:  Procedure Laterality Date   BASAL CELL CARCINOMA EXCISION     Social History   Socioeconomic History   Marital status: Married    Spouse name: Not on file   Number of children: Not on file   Years of education: Not on file   Highest education level: Not on file  Occupational History   Not on file  Tobacco Use   Smoking status: Never   Smokeless tobacco: Never  Substance and Sexual Activity   Alcohol use: Not on file   Drug use: Not on file   Sexual activity: Not on file  Other Topics Concern   Not on file  Social History Narrative   Not on file   Social Determinants of Health   Financial Resource Strain: Not on file  Food  Insecurity: Not on file  Transportation Needs: Not on file  Physical Activity: Not on file  Stress: Not on file  Social Connections: Not on file     Family History  Problem Relation Age of Onset   Lung cancer Mother    Cancer Father       Current Outpatient Medications  Medication Sig Dispense Refill   aspirin EC 81 MG tablet Take 1 tablet (81 mg total) by mouth daily. Swallow whole. 90 tablet 3   buPROPion (WELLBUTRIN XL) 300 MG 24 hr tablet Take 300 mg by mouth daily.     cetirizine (ZYRTEC) 10 MG tablet 1 tablet     clonazePAM (KLONOPIN) 0.5 MG tablet Take 0.75 mg by mouth 2 (two) times daily.     clopidogrel (PLAVIX) 75 MG tablet Take 1 tablet (75 mg total) by mouth daily. 90 tablet 3   esomeprazole (NEXIUM) 40 MG capsule 1 capsule     fluticasone (FLONASE) 50 MCG/ACT nasal spray 1 spray in each nostril     IBU 600 MG tablet Take 600 mg by mouth 3 (three) times daily as needed.     metoprolol tartrate (LOPRESSOR) 100 MG tablet Take 2 hours prior to CT scan 1 tablet 0  rosuvastatin (CRESTOR) 20 MG tablet Take 20 mg by mouth daily.     sertraline (ZOLOFT) 100 MG tablet Take 100 mg by mouth daily.     tadalafil (CIALIS) 20 MG tablet 1 tablet     telmisartan-hydrochlorothiazide (MICARDIS HCT) 40-12.5 MG tablet Take 1 tablet by mouth daily.     terbinafine (LAMISIL) 250 MG tablet Take 1 tablet (250 mg total) by mouth daily. 90 tablet 0   traZODone (DESYREL) 100 MG tablet 0.5 to 1 tablet at bedtime as needed     triamcinolone (KENALOG) 0.1 % paste SMARTSIG:TO TEETH PRN     Current Facility-Administered Medications  Medication Dose Route Frequency Provider Last Rate Last Admin   sodium chloride flush (NS) 0.9 % injection 3 mL  3 mL Intravenous Q12H Crenshaw, Denice Bors, MD        Allergies:   Patient has no known allergies.   ROS:  Please see the history of present illness.   Otherwise, review of systems are positive for none.   All other systems are reviewed and negative.     PHYSICAL EXAM: VS:  BP 108/78   Pulse 63   Ht 6\' 5"  (1.956 m)   Wt 255 lb 6.4 oz (115.8 kg)   SpO2 97%   BMI 30.29 kg/m  , BMI Body mass index is 30.29 kg/m. GENERAL:  Well appearing NECK:  No jugular venous distention, waveform within normal limits, carotid upstroke brisk and symmetric, no bruits, no thyromegaly LUNGS:  Clear to auscultation bilaterally CHEST:  Unremarkable HEART:  PMI not displaced or sustained,S1 and S2 within normal limits, no S3, no S4, no clicks, no rubs, no murmurs ABD:  Flat, positive bowel sounds normal in frequency in pitch, no bruits, no rebound, no guarding, no midline pulsatile mass, no hepatomegaly, no splenomegaly EXT:  2 plus pulses throughout, no edema, no cyanosis no clubbing   EKG:  EKG is  ordered today. The ekg ordered today demonstrates sinus rhythm, rate 63, axis within normal, intervals within normal limits, no acute ST-T wave changes.   Recent Labs: 07/20/2021: BUN 25; Creatinine, Ser 1.10; Potassium 4.3; Sodium 142    Lipid Panel No results found for: CHOL, TRIG, HDL, CHOLHDL, VLDL, LDLCALC, LDLDIRECT    Wt Readings from Last 3 Encounters:  08/02/21 255 lb 6.4 oz (115.8 kg)  06/20/21 257 lb (116.6 kg)      Other studies Reviewed: Additional studies/ records that were reviewed today include: CT. Review of the above records demonstrates:  Please see elsewhere in the note.     ASSESSMENT AND PLAN:  CHEST PAIN/OBSTRUCTIVE CAD:   He has chest pain, shortness of breath and obstructive lesions on his coronary CT.   This would be high risk including his LAD lesion  Cardiac cath is indicated.  The patient understands that risks included but are not limited to stroke (1 in 1000), death (1 in 102), kidney failure [usually temporary] (1 in 500), bleeding (1 in 200), allergic reaction [possibly serious] (1 in 200).  The patient understands and agrees to proceed.  He has been treated with Plavix which she will pick up today at the  pharmacy.  DYSLIPIDEMIA:   He is on statin.  Goal LDL will be less than 70.    Current medicines are reviewed at length with the patient today.  The patient does not have concerns regarding medicines.  The following changes have been made:  None  Labs/ tests ordered today include:   Orders Placed This Encounter  Procedures   Basic metabolic panel   CBC   EKG 12-Lead       Disposition:   FU with me or APP after the cath.    Signed, Minus Breeding, MD  08/02/2021 5:31 PM    [place]

## 2021-08-01 NOTE — Progress Notes (Signed)
avix

## 2021-08-02 ENCOUNTER — Encounter: Payer: Self-pay | Admitting: Cardiology

## 2021-08-02 ENCOUNTER — Other Ambulatory Visit: Payer: Self-pay

## 2021-08-02 ENCOUNTER — Ambulatory Visit: Payer: 59 | Admitting: Cardiology

## 2021-08-02 VITALS — BP 108/78 | HR 63 | Ht 77.0 in | Wt 255.4 lb

## 2021-08-02 DIAGNOSIS — R072 Precordial pain: Secondary | ICD-10-CM

## 2021-08-02 NOTE — Patient Instructions (Addendum)
Medication Instructions:  The current medical regimen is effective;  continue present plan and medications.  *If you need a refill on your cardiac medications before your next appointment, please call your pharmacy*   Lab Work: BMET, CBC today   If you have labs (blood work) drawn today and your tests are completely normal, you will receive your results only by: [place] (if you have MyChart) OR A paper copy in the mail If you have any lab test that is abnormal or we need to change your treatment, we will call you to review the results.   Testing/Procedures: Your physician has requested that you have a cardiac catheterization. Cardiac catheterization is used to diagnose and/or treat various heart conditions. Doctors may recommend this procedure for a number of different reasons. The most common reason is to evaluate chest pain. Chest pain can be a symptom of coronary artery disease (CAD), and cardiac catheterization can show whether plaque is narrowing or blocking your heart's arteries. This procedure is also used to evaluate the valves, as well as measure the blood flow and oxygen levels in different parts of your heart. For further information please visit [email or web address]. Please follow instruction sheet, as given.    Follow-Up: At [facility], you and your health needs are our priority.  As part of our continuing mission to provide you with exceptional heart care, we have created designated Provider Care Teams.  These Care Teams include your primary Cardiologist (physician) and Advanced Practice Providers (APPs -  Physician Assistants and Nurse Practitioners) who all work together to provide you with the care you need, when you need it.  We recommend signing up for the patient portal called "MyChart".  Sign up information is provided on this After Visit Summary.  MyChart is used to connect with patients for Virtual Visits (Telemedicine).  Patients are able to view lab/test  results, encounter notes, upcoming appointments, etc.  Non-urgent messages can be sent to your provider as well.   To learn more about what you can do with MyChart, go to [email or web address].    Your next appointment:   2 week(s) post CATH   The format for your next appointment:   In Person  Provider:   You will see one of the following Advanced Practice Providers on your designated Care Team:   Rosaria Ferries, PA-C Caron Presume, PA-C Jory Sims, DNP, ANP   Other Instructions  LAKSH HINNERS                       08/01/2021   You are scheduled for a Cardiac Catheterization on Thursday, November 10 with Dr. Peter Martinique.   1. Please arrive at the [facility] (Main Entrance A) at [facility]: [address] [place], [place] [address] at 5:30 AM (This time is two hours before your procedure to ensure your preparation). Free valet parking service is available.    Special note: Every effort is made to have your procedure done on time. Please understand that emergencies sometimes delay scheduled procedures.   2. Diet: Do not eat solid foods after midnight.  The patient may have clear liquids until 5am upon the day of the procedure.   3. Labs: You will need to have blood drawn on Tuesday 08/02/21 at your follow up appointment.   4. Medication instructions in preparation for your procedure:   On the morning of your procedure, take your Aspirin and any morning medicines NOT listed above.  You may  use sips of water.   5. Plan for one night stay--bring personal belongings. 6. Bring a current list of your medications and current insurance cards. 7. You MUST have a responsible person to drive you home. 8. Someone MUST be with you the first 24 hours after you arrive home or your discharge will be delayed. 9. Please wear clothes that are easy to get on and off and wear slip-on shoes.   Thank you for allowing [place] to care for you!   -- [facility]  [facility]    I have sent a prescription into the pharmacy for clopidogrel or plavix 75 mg one tablet once daily. You will also take this medication the morning of your procedure.   Please let me know if you have any questions or bring them with you to your appointment tomorrow.

## 2021-08-03 LAB — BASIC METABOLIC PANEL
BUN/Creatinine Ratio: 21 (ref 10–24)
BUN: 24 mg/dL (ref 8–27)
CO2: 24 mmol/L (ref 20–29)
Calcium: 9.6 mg/dL (ref 8.6–10.2)
Chloride: 100 mmol/L (ref 96–106)
Creatinine, Ser: 1.15 mg/dL (ref 0.76–1.27)
Glucose: 88 mg/dL (ref 70–99)
Potassium: 4.4 mmol/L (ref 3.5–5.2)
Sodium: 138 mmol/L (ref 134–144)
eGFR: 71 mL/min/{1.73_m2} (ref >59)

## 2021-08-03 LAB — CBC
Hematocrit: 45.2 % (ref 37.5–51.0)
Hemoglobin: 15 g/dL (ref 13.0–17.7)
MCH: 29.5 pg (ref 26.6–33.0)
MCHC: 33.2 g/dL (ref 31.5–35.7)
MCV: 89 fL (ref 79–97)
Platelets: 177 10*3/uL (ref 150–450)
RBC: 5.08 x10E6/uL (ref 4.14–5.80)
RDW: 13.1 % (ref 11.6–15.4)
WBC: 9.9 10*3/uL (ref 3.4–10.8)

## 2021-08-09 ENCOUNTER — Telehealth: Payer: Self-pay | Admitting: *Deleted

## 2021-08-09 ENCOUNTER — Encounter: Payer: Self-pay | Admitting: *Deleted

## 2021-08-09 NOTE — Telephone Encounter (Signed)
Cardiac catheterization scheduled at [facility] for: Thursday August 10, 2021 7:30 [facility] Main Entrance A [facility]) at: 5:30 AM   No solid food after midnight prior to cath, clear liquids until 5 AM day of procedure.  Medication instructions: Hold: Telmisartan-HCT -AM of procedure  Except hold medications usual morning medications can be taken pre-cath with sips of water including: - aspirin 81 mg -Plavix 75 mg    Confirmed patient has responsible adult to drive home post procedure and be with patient first 24 hours after arriving home.  [facility] does allow one visitor to accompany you and wait in the hospital waiting room while you are there for your procedure. You and your visitor will be asked to wear a mask once you enter the hospital.   Patient reports does not currently have any new symptoms concerning for COVID-19 and no household members with COVID-19 like illness.    Reviewed procedure/mask/visitor instructions with patient.

## 2021-08-10 ENCOUNTER — Other Ambulatory Visit: Payer: Self-pay

## 2021-08-10 ENCOUNTER — Ambulatory Visit (HOSPITAL_COMMUNITY)
Admission: RE | Disposition: A | Discharge status: Home / Self Care | Payer: Self-pay | Source: Home / Self Care | Attending: Cardiology

## 2021-08-10 ENCOUNTER — Encounter (HOSPITAL_COMMUNITY): Payer: Self-pay | Admitting: Cardiology

## 2021-08-10 ENCOUNTER — Ambulatory Visit (HOSPITAL_COMMUNITY)
Admission: RE | Admit: 2021-08-10 | Discharge: 2021-08-10 | Disposition: A | Discharge status: Home / Self Care | Payer: 59 | Attending: Cardiology | Admitting: Cardiology

## 2021-08-10 DIAGNOSIS — R0602 Shortness of breath: Secondary | ICD-10-CM | POA: Insufficient documentation

## 2021-08-10 DIAGNOSIS — I2510 Atherosclerotic heart disease of native coronary artery without angina pectoris: Secondary | ICD-10-CM | POA: Diagnosis present

## 2021-08-10 DIAGNOSIS — E785 Hyperlipidemia, unspecified: Secondary | ICD-10-CM | POA: Diagnosis not present

## 2021-08-10 DIAGNOSIS — R931 Abnormal findings on diagnostic imaging of heart and coronary circulation: Secondary | ICD-10-CM | POA: Diagnosis present

## 2021-08-10 DIAGNOSIS — Z7902 Long term (current) use of antithrombotics/antiplatelets: Secondary | ICD-10-CM | POA: Insufficient documentation

## 2021-08-10 DIAGNOSIS — R072 Precordial pain: Secondary | ICD-10-CM

## 2021-08-10 DIAGNOSIS — I25118 Atherosclerotic heart disease of native coronary artery with other forms of angina pectoris: Secondary | ICD-10-CM

## 2021-08-10 HISTORY — PX: LEFT HEART CATH AND CORONARY ANGIOGRAPHY: CATH118249

## 2021-08-10 SURGERY — LEFT HEART CATH AND CORONARY ANGIOGRAPHY
Anesthesia: LOCAL

## 2021-08-10 MED ORDER — MIDAZOLAM HCL 2 MG/2ML IJ SOLN
INTRAMUSCULAR | Status: AC
  Filled 2021-08-10: qty 2

## 2021-08-10 MED ORDER — FENTANYL CITRATE (PF) 100 MCG/2ML IJ SOLN
INTRAMUSCULAR | Status: DC | PRN
  Administered 2021-08-10: 25 ug via INTRAVENOUS

## 2021-08-10 MED ORDER — ONDANSETRON HCL 4 MG/2ML IJ SOLN: 4.0000 mg | Freq: Four times a day (QID) | INTRAMUSCULAR | Status: DC | PRN

## 2021-08-10 MED ORDER — SODIUM CHLORIDE 0.9 % WEIGHT BASED INFUSION
3.0000 mL/kg/h | INTRAVENOUS | Status: AC
  Administered 2021-08-10: 3 mL/kg/h via INTRAVENOUS

## 2021-08-10 MED ORDER — MIDAZOLAM HCL 2 MG/2ML IJ SOLN
INTRAMUSCULAR | Status: DC | PRN
  Administered 2021-08-10: 1 mg via INTRAVENOUS

## 2021-08-10 MED ORDER — HEPARIN (PORCINE) IN NACL 1000-0.9 UT/500ML-% IV SOLN
INTRAVENOUS | Status: DC | PRN
  Administered 2021-08-10 (×2): 500 mL

## 2021-08-10 MED ORDER — SODIUM CHLORIDE 0.9 % WEIGHT BASED INFUSION: 1.0000 mL/kg/h | INTRAVENOUS | Status: DC

## 2021-08-10 MED ORDER — SODIUM CHLORIDE 0.9 % IV SOLN: 250.0000 mL | INTRAVENOUS | Status: DC | PRN

## 2021-08-10 MED ORDER — HEPARIN SODIUM (PORCINE) 1000 UNIT/ML IJ SOLN
INTRAMUSCULAR | Status: AC
  Filled 2021-08-10: qty 1

## 2021-08-10 MED ORDER — VERAPAMIL HCL 2.5 MG/ML IV SOLN
INTRAVENOUS | Status: AC
  Filled 2021-08-10: qty 2

## 2021-08-10 MED ORDER — ASPIRIN 81 MG PO CHEW: 81.0000 mg | CHEWABLE_TABLET | ORAL | Status: DC

## 2021-08-10 MED ORDER — HEPARIN SODIUM (PORCINE) 1000 UNIT/ML IJ SOLN
INTRAMUSCULAR | Status: DC | PRN
  Administered 2021-08-10: 5000 [IU] via INTRAVENOUS

## 2021-08-10 MED ORDER — HEPARIN (PORCINE) IN NACL 1000-0.9 UT/500ML-% IV SOLN
INTRAVENOUS | Status: AC
  Filled 2021-08-10: qty 1000

## 2021-08-10 MED ORDER — SODIUM CHLORIDE 0.9% FLUSH: 3.0000 mL | INTRAVENOUS | Status: DC | PRN

## 2021-08-10 MED ORDER — SODIUM CHLORIDE 0.9% FLUSH: 3.0000 mL | Freq: Two times a day (BID) | INTRAVENOUS | Status: DC

## 2021-08-10 MED ORDER — VERAPAMIL HCL 2.5 MG/ML IV SOLN
INTRAVENOUS | Status: DC | PRN
  Administered 2021-08-10: 10 mL via INTRA_ARTERIAL

## 2021-08-10 MED ORDER — FENTANYL CITRATE (PF) 100 MCG/2ML IJ SOLN
INTRAMUSCULAR | Status: AC
  Filled 2021-08-10: qty 2

## 2021-08-10 MED ORDER — IOHEXOL 350 MG/ML SOLN
INTRAVENOUS | Status: DC | PRN
  Administered 2021-08-10: 70 mL

## 2021-08-10 MED ORDER — ACETAMINOPHEN 325 MG PO TABS: 650.0000 mg | ORAL_TABLET | ORAL | Status: DC | PRN

## 2021-08-10 MED ORDER — LIDOCAINE HCL (PF) 1 % IJ SOLN
INTRAMUSCULAR | Status: AC
  Filled 2021-08-10: qty 30

## 2021-08-10 SURGICAL SUPPLY — 9 items
CATH 5FR JL3.5 JR4 ANG PIG MP (CATHETERS) ×2 IMPLANT
DEVICE RAD COMP TR BAND LRG (VASCULAR PRODUCTS) ×2 IMPLANT
GLIDESHEATH SLEND SS 6F .021 (SHEATH) ×2 IMPLANT
GUIDEWIRE INQWIRE 1.5J.035X260 (WIRE) ×1 IMPLANT
INQWIRE 1.5J .035X260CM (WIRE) ×2
KIT HEART LEFT (KITS) ×2 IMPLANT
PACK CARDIAC CATHETERIZATION (CUSTOM PROCEDURE TRAY) ×2 IMPLANT
TRANSDUCER W/STOPCOCK (MISCELLANEOUS) ×2 IMPLANT
TUBING CIL FLEX 10 FLL-RA (TUBING) ×2 IMPLANT

## 2021-08-10 NOTE — Interval H&P Note (Signed)
History and Physical Interval Note:  08/10/2021 7:10 AM  Noah Bradley  has presented today for surgery, with the diagnosis of abnormal ct.  The various methods of treatment have been discussed with the patient and family. After consideration of risks, benefits and other options for treatment, the patient has consented to  Procedure(s): LEFT HEART CATH AND CORONARY ANGIOGRAPHY (N/A) as a surgical intervention.  The patient's history has been reviewed, patient examined, no change in status, stable for surgery.  I have reviewed the patient's chart and labs.  Questions were answered to the patient's satisfaction.   Cath Lab Visit (complete for each Cath Lab visit)  Clinical Evaluation Leading to the Procedure:   ACS: No.  Non-ACS:    Anginal Classification: CCS I  Anti-ischemic medical therapy: Minimal Therapy (1 class of medications)  Non-Invasive Test Results: Intermediate-risk stress test findings: cardiac mortality 1-3%/year  Prior CABG: No previous CABG        Noah Bradley [facility] 08/10/2021 7:10 AM

## 2021-08-11 MED FILL — Lidocaine HCl Local Preservative Free (PF) Inj 1%: INTRAMUSCULAR | Qty: 30 | Status: AC

## 2021-08-28 NOTE — Progress Notes (Signed)
Cardiology Office Note   Date:  09/01/2021   ID:  Noah Bradley, DOB 1955-12-26, MRN [id number]  PCP:  Deland Pretty, MD  Cardiologist:   None Referring:  Deland Pretty, MD  Chief Complaint  Patient presents with   Coronary Artery Disease      History of Present Illness: Noah Bradley is a 65 y.o. adult who presents for evaluation of exertional chest pain.  He had a calcium score of 513 in May.   POET (Plain Old Exercise Treadmill) demonstrated hypertensive response to exercise with nonspecific ST changes.  Coronary CT demonstrated two-vessel obstructive coronary disease.  There was CTO of the posterolateral branch.  This was of the RCA.  There is segmental 80% stenosis of the first OM 90% stenosis at the bifurcation of the first lateral branch.  The LAD had nonobstructive disease.  The EF was well-preserved.  Because of the chronic total occlusion of the bifurcation disease and was decided to manage medically.    Is done very well.  He remains without symptoms.The patient denies any new symptoms such as chest discomfort, neck or arm discomfort. There has been no new shortness of breath, PND or orthopnea. There have been no reported palpitations, presyncope or syncope.  He is very active although he has not been exercising the last couple of days.   Past Medical History:  Diagnosis Date   Essential tremor    GAD (generalized anxiety disorder)    HTN (hypertension)    Hyperlipemia     Past Surgical History:  Procedure Laterality Date   BASAL CELL CARCINOMA EXCISION     LEFT HEART CATH AND CORONARY ANGIOGRAPHY N/A 08/10/2021   Procedure: LEFT HEART CATH AND CORONARY ANGIOGRAPHY;  Surgeon: Martinique, Peter M, MD;  Location: [facility];  Service: Cardiovascular;  Laterality: N/A;     Current Outpatient Medications  Medication Sig Dispense Refill   albuterol (VENTOLIN HFA) 108 (90 Base) MCG/ACT inhaler Inhale 2 puffs into the lungs every 6 (six) hours as needed for  wheezing or shortness of breath.     aspirin EC 81 MG tablet Take 1 tablet (81 mg total) by mouth daily. Swallow whole. 90 tablet 3   b complex vitamins capsule Take 1 capsule by mouth daily.     buPROPion (WELLBUTRIN XL) 300 MG 24 hr tablet Take 300 mg by mouth daily.     Carboxymethylcell-Hypromellose (GENTEAL OP) Place 1 drop into both eyes 3 (three) times daily.     cetirizine (ZYRTEC) 10 MG tablet Take 10 mg by mouth daily.     Cholecalciferol (VITAMIN D) 50 MCG (2000 UT) tablet Take 2,000 Units by mouth daily.     clonazePAM (KLONOPIN) 0.5 MG tablet Take 0.5-0.75 mg by mouth 2 (two) times daily as needed for anxiety.     clopidogrel (PLAVIX) 75 MG tablet Take 1 tablet (75 mg total) by mouth daily. 90 tablet 3   COLLAGEN PO Take 3 capsules by mouth daily.     esomeprazole (NEXIUM) 20 MG capsule Take 20 mg by mouth daily.     IBU 600 MG tablet Take 600 mg by mouth 3 (three) times daily as needed for moderate pain.     rosuvastatin (CRESTOR) 20 MG tablet Take 20 mg by mouth daily.     sertraline (ZOLOFT) 100 MG tablet Take 200 mg by mouth daily.     tadalafil (CIALIS) 20 MG tablet Take 20 mg by mouth daily as needed for erectile dysfunction.  telmisartan-hydrochlorothiazide (MICARDIS HCT) 40-12.5 MG tablet Take 1 tablet by mouth daily.     triamcinolone (KENALOG) 0.1 % paste Use as directed 1 application in the mouth or throat daily as needed (mouth ulcers).     vitamin B-12 (CYANOCOBALAMIN) 500 MCG tablet Take 500 mcg by mouth daily.     vitamin C (ASCORBIC ACID) 500 MG tablet Take 500 mg by mouth daily.     No current facility-administered medications for this visit.    Allergies:   Patient has no known allergies.   ROS:  Please see the history of present illness.   Otherwise, review of systems are positive for none.   All other systems are reviewed and negative.    PHYSICAL EXAM: VS:  BP 112/80   Pulse 60   SpO2 98%  , BMI There is no height or weight on file to calculate  BMI. GENERAL:  Well appearing NECK:  No jugular venous distention, waveform within normal limits, carotid upstroke brisk and symmetric, no bruits, no thyromegaly LUNGS:  Clear to auscultation bilaterally CHEST:  Unremarkable HEART:  PMI not displaced or sustained,S1 and S2 within normal limits, no S3, no S4, no clicks, no rubs, no murmurs ABD:  Flat, positive bowel sounds normal in frequency in pitch, no bruits, no rebound, no guarding, no midline pulsatile mass, no hepatomegaly, no splenomegaly EXT:  2 plus pulses throughout, no edema, no cyanosis no clubbing   EKG:  EKG is not ordered today.   Cardiac Cath   Diagnostic Dominance: Right   Recent Labs: 08/02/2021: BUN 24; Creatinine, Ser 1.15; Hemoglobin 15.0; Platelets 177; Potassium 4.4; Sodium 138    Lipid Panel No results found for: CHOL, TRIG, HDL, CHOLHDL, VLDL, LDLCALC, LDLDIRECT    Wt Readings from Last 3 Encounters:  08/10/21 255 lb (115.7 kg)  08/02/21 255 lb 6.4 oz (115.8 kg)  06/20/21 257 lb (116.6 kg)      Other studies Reviewed: Additional studies/ records that were reviewed today include: Cath reviewed in the room with the patient.  Review of the above records demonstrates:  Please see elsewhere in the note.     ASSESSMENT AND PLAN:  CHEST PAIN/OBSTRUCTIVE CAD:    The patient has no new sypmtoms.  No further cardiovascular testing is indicated.  We will continue with aggressive risk reduction .  He can stop his Plavix.   DYSLIPIDEMIA:   He is on statin for about 8 weeks.  I will have him come back in January for a lipid profile with a goal LDL less than 70.  Current medicines are reviewed at length with the patient today.  The patient does not have concerns regarding medicines.  The following changes have been made: As above  Labs/ tests ordered today include:   Orders Placed This Encounter  Procedures   Lipid panel        Disposition:   FU with me 12 months   Signed, Minus Breeding, MD   09/01/2021 8:22 AM    [place]

## 2021-09-01 ENCOUNTER — Ambulatory Visit: Payer: 59 | Admitting: Cardiology

## 2021-09-01 ENCOUNTER — Encounter: Payer: Self-pay | Admitting: Cardiology

## 2021-09-01 ENCOUNTER — Other Ambulatory Visit: Payer: Self-pay

## 2021-09-01 VITALS — BP 112/80 | HR 60

## 2021-09-01 DIAGNOSIS — I25118 Atherosclerotic heart disease of native coronary artery with other forms of angina pectoris: Secondary | ICD-10-CM | POA: Diagnosis not present

## 2021-09-01 NOTE — Patient Instructions (Signed)
Medication Instructions:  Stop taking plavix.  *If you need a refill on your cardiac medications before your next appointment, please call your pharmacy*   Lab Work: In January, get blood work (fasting lipid panel).  If you have labs (blood work) drawn today and your tests are completely normal, you will receive your results only by: [place] (if you have MyChart) OR A paper copy in the mail If you have any lab test that is abnormal or we need to change your treatment, we will call you to review the results.   Testing/Procedures: None  Follow-Up: At [facility], you and your health needs are our priority.  As part of our continuing mission to provide you with exceptional heart care, we have created designated Provider Care Teams.  These Care Teams include your primary Cardiologist (physician) and Advanced Practice Providers (APPs -  Physician Assistants and Nurse Practitioners) who all work together to provide you with the care you need, when you need it.  We recommend signing up for the patient portal called "MyChart".  Sign up information is provided on this After Visit Summary.  MyChart is used to connect with patients for Virtual Visits (Telemedicine).  Patients are able to view lab/test results, encounter notes, upcoming appointments, etc.  Non-urgent messages can be sent to your provider as well.   To learn more about what you can do with MyChart, go to [email or web address].    Your next appointment:   1 year(s)  The format for your next appointment:   In Person  Provider:   Dr. Vita Barley, MD

## 2021-10-04 ENCOUNTER — Encounter: Payer: Self-pay | Admitting: Cardiology

## 2021-11-08 LAB — LIPID PANEL
Chol/HDL Ratio: 4.1 ratio (ref 0.0–5.0)
Cholesterol, Total: 134 mg/dL (ref 100–199)
HDL: 33 mg/dL — ABNORMAL LOW (ref >39)
LDL Chol Calc (NIH): 70 mg/dL (ref 0–99)
Triglycerides: 180 mg/dL — ABNORMAL HIGH (ref 0–149)
VLDL Cholesterol Cal: 31 mg/dL (ref 5–40)

## 2021-11-13 ENCOUNTER — Encounter: Payer: Self-pay | Admitting: *Deleted

## 2022-01-04 ENCOUNTER — Other Ambulatory Visit: Payer: Self-pay | Admitting: Podiatry

## 2022-02-12 ENCOUNTER — Ambulatory Visit: Payer: 59 | Admitting: Podiatry

## 2022-02-12 ENCOUNTER — Other Ambulatory Visit: Payer: 59

## 2022-02-12 DIAGNOSIS — M79675 Pain in left toe(s): Secondary | ICD-10-CM | POA: Diagnosis not present

## 2022-02-12 DIAGNOSIS — B351 Tinea unguium: Secondary | ICD-10-CM

## 2022-02-12 DIAGNOSIS — M79674 Pain in right toe(s): Secondary | ICD-10-CM

## 2022-02-12 MED ORDER — TERBINAFINE HCL 250 MG PO TABS: 250.0000 mg | ORAL_TABLET | Freq: Every day | ORAL | 0 refills | Status: AC

## 2022-02-12 NOTE — Progress Notes (Signed)
? ?  Subjective: ?66 y.o. adult presenting today for follow-up evaluation of onychomycosis of toenails.  He was last seen in the office 04/19/2021 at that time we did prescribe Lamisil 2 and 50 mg #90 daily.  He tolerated this well.  There was some improvement however he believes that possibly another round of antifungal may be beneficial.  He presents for follow-up treatment and evaluation ? ?Past Medical History:  ?Diagnosis Date  ? Essential tremor   ? GAD (generalized anxiety disorder)   ? HTN (hypertension)   ? Hyperlipemia   ? ?Past Surgical History:  ?Procedure Laterality Date  ? BASAL CELL CARCINOMA EXCISION    ? LEFT HEART CATH AND CORONARY ANGIOGRAPHY N/A 08/10/2021  ? Procedure: LEFT HEART CATH AND CORONARY ANGIOGRAPHY;  Surgeon: Martinique, Peter M, MD;  Location: [facility];  Service: Cardiovascular;  Laterality: N/A;  ? ?No Known Allergies ? ?Objective: ?Physical Exam ?General: The patient is alert and oriented x3 in no acute distress. ? ?Dermatology: Hyperkeratotic, discolored, thickened, onychodystrophy noted to the bilateral great toenails.  The base of the toenails definitely appears more healthy and viable compared to the distal portions skin is warm, dry and supple bilateral lower extremities. Negative for open lesions or macerations. ? ?Vascular: Palpable pedal pulses bilaterally. No edema or erythema noted. Capillary refill within normal limits. ? ?Neurological: Epicritic and protective threshold grossly intact bilaterally.  ? ?Musculoskeletal Exam: Range of motion within normal limits to all pedal and ankle joints bilateral. Muscle strength 5/5 in all groups bilateral.  ? ?Assessment: ?#1 Onychomycosis of toenails ? ?Plan of Care:  ?#1 Patient was evaluated. ?#2  Today we discussed different treatment options including oral, topical, and laser antifungal treatment modalities.  We discussed their efficacies and side effects.  Patient opts for oral antifungal treatment modality ?#3  prescription for Lamisil 250 mg #90 daily. denies a history of liver pathology or symptoms.  Patient is otherwise healthy and tolerated the prescription 6 months ago. ?#4  We will go ahead and order a new hepatic function panel.  Discontinue if abnormal  ?#5 return to clinic 6 months ? ? ?Edrick Kins, DPM ?[place] ? ?Dr. Edrick Kins, DPM  ?  ?[address]                                    ?[place], [place] [address]                ?Office [phone]  ?Fax [phone] ? ? ? ? ?

## 2022-02-13 LAB — HEPATIC FUNCTION PANEL
ALT: 20 IU/L (ref 0–44)
AST: 21 IU/L (ref 0–40)
Albumin: 4.3 g/dL (ref 3.8–4.8)
Alkaline Phosphatase: 84 IU/L (ref 44–121)
Bilirubin Total: 0.4 mg/dL (ref 0.0–1.2)
Bilirubin, Direct: 0.13 mg/dL (ref 0.00–0.40)
Total Protein: 7 g/dL (ref 6.0–8.5)

## 2022-06-05 ENCOUNTER — Other Ambulatory Visit: Payer: Self-pay | Admitting: Podiatry

## 2022-09-06 ENCOUNTER — Other Ambulatory Visit: Payer: Self-pay | Admitting: Podiatry

## 2022-11-15 DIAGNOSIS — E785 Hyperlipidemia, unspecified: Secondary | ICD-10-CM | POA: Insufficient documentation

## 2022-11-15 NOTE — Progress Notes (Signed)
Cardiology Office Note   Date:  11/16/2022   ID:  [name], DOB Mar 08, 1956, MRN [id number]  PCP:  Deland Pretty, MD  Cardiologist:   Minus Breeding, MD Referring:  Deland Pretty, MD  Chief Complaint  Patient presents with   Coronary Artery Disease    History of Present Illness: Noah Bradley is a 67 y.o. adult who presents for evaluation of exertional chest pain.  He had a calcium score of 513 in May.   POET (Plain Old Exercise Treadmill) demonstrated hypertensive response to exercise with nonspecific ST changes.  Coronary CT demonstrated two-vessel obstructive coronary disease.  There was CTO of the posterolateral branch.  This was of the RCA.  There is segmental 80% stenosis of the first OM 90% stenosis at the bifurcation of the first lateral branch.  The LAD had nonobstructive disease.  The EF was well-preserved.  Because of the chronic total occlusion of the bifurcation disease and was decided to manage medically.    Since I last saw him he has done okay.  Unfortunately his wife's been battling multiple myeloma and had a femur fracture.  He said be at home taking care of her.  He has been able to exercise is much.  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  He has been more sedentary.  Has not been eating well and actually has lost 10 pounds.  He is starting to do a little walking at his church.  With his level of activity he denies any cardiovascular symptoms. The patient denies any new symptoms such as chest discomfort, neck or arm discomfort. There has been no new shortness of breath, PND or orthopnea. There have been no reported palpitations, presyncope or syncope.   Past Medical History:  Diagnosis Date   Essential tremor    GAD (generalized anxiety disorder)    HTN (hypertension)    Hyperlipemia     Past Surgical History:  Procedure Laterality Date   BASAL CELL CARCINOMA EXCISION     LEFT HEART CATH AND CORONARY ANGIOGRAPHY N/A 08/10/2021   Procedure:  LEFT HEART CATH AND CORONARY ANGIOGRAPHY;  Surgeon: Martinique, Peter M, MD;  Location: [facility];  Service: Cardiovascular;  Laterality: N/A;     Current Outpatient Medications  Medication Sig Dispense Refill   albuterol (VENTOLIN HFA) 108 (90 Base) MCG/ACT inhaler Inhale 2 puffs into the lungs every 6 (six) hours as needed for wheezing or shortness of breath.     aspirin EC 81 MG tablet Take 1 tablet (81 mg total) by mouth daily. Swallow whole. 90 tablet 3   b complex vitamins capsule Take 1 capsule by mouth daily.     buPROPion (WELLBUTRIN XL) 300 MG 24 hr tablet Take 300 mg by mouth daily.     Carboxymethylcell-Hypromellose (GENTEAL OP) Place 1 drop into both eyes 3 (three) times daily.     cetirizine (ZYRTEC) 10 MG tablet Take 10 mg by mouth daily.     Cholecalciferol (VITAMIN D) 50 MCG (2000 UT) tablet Take 2,000 Units by mouth daily.     clonazePAM (KLONOPIN) 0.5 MG tablet Take 0.5-0.75 mg by mouth 2 (two) times daily as needed for anxiety.     COLLAGEN PO Take 3 capsules by mouth daily.     esomeprazole (NEXIUM) 20 MG capsule Take 20 mg by mouth daily.     IBU 600 MG tablet Take 600 mg by mouth 3 (three) times daily as needed for moderate pain.  olmesartan (BENICAR) 20 MG tablet Take 20 mg by mouth daily.     rosuvastatin (CRESTOR) 20 MG tablet Take 20 mg by mouth daily.     sertraline (ZOLOFT) 100 MG tablet Take 200 mg by mouth daily.     tadalafil (CIALIS) 20 MG tablet Take 20 mg by mouth daily as needed for erectile dysfunction.     terbinafine (LAMISIL) 250 MG tablet Take 1 tablet (250 mg total) by mouth daily. 90 tablet 0   triamcinolone (KENALOG) 0.1 % paste Use as directed 1 application in the mouth or throat daily as needed (mouth ulcers).     vitamin C (ASCORBIC ACID) 500 MG tablet Take 500 mg by mouth daily.     No current facility-administered medications for this visit.    Allergies:   Patient has no known allergies.   ROS:  Please see the history of present  illness.   Otherwise, review of systems are positive for essential tremor.   All other systems are reviewed and negative.    PHYSICAL EXAM: VS:  BP 99/65   Pulse 71   Ht 6' 4"$  (1.93 m)   Wt 246 lb (111.6 kg)   SpO2 92%   BMI 29.94 kg/m  , BMI Body mass index is 29.94 kg/m. GENERAL:  Well appearing NECK:  No jugular venous distention, waveform within normal limits, carotid upstroke brisk and symmetric, no bruits, no thyromegaly LUNGS:  Clear to auscultation bilaterally CHEST:  Unremarkable HEART:  PMI not displaced or sustained,S1 and S2 within normal limits, no S3, no S4, no clicks, no rubs, no murmurs ABD:  Flat, positive bowel sounds normal in frequency in pitch, no bruits, no rebound, no guarding, no midline pulsatile mass, no hepatomegaly, no splenomegaly EXT:  2 plus pulses throughout, no edema, no cyanosis no clubbing   EKG:  EKG is  ordered today. Sinus rhythm, rate 71, axis within normal limits, intervals within normal limits, no acute ST-T wave changes.   Cardiac Cath   Diagnostic Dominance: Right   Recent Labs: 02/12/2022: ALT 20    Lipid Panel    Component Value Date/Time   CHOL 134 11/08/2021 0925   TRIG 180 (H) 11/08/2021 0925   HDL 33 (L) 11/08/2021 0925   CHOLHDL 4.1 11/08/2021 0925   LDLCALC 70 11/08/2021 0925      Wt Readings from Last 3 Encounters:  11/16/22 246 lb (111.6 kg)  08/10/21 255 lb (115.7 kg)  08/02/21 255 lb 6.4 oz (115.8 kg)      Other studies Reviewed: Additional studies/ records that were reviewed today include: None Review of the above records demonstrates:  Please see elsewhere in the note.     ASSESSMENT AND PLAN:  CHEST PAIN/OBSTRUCTIVE CAD:    The patient has no new sypmtoms.  No further cardiovascular testing is indicated.  We will continue with aggressive risk reduction and meds as listed.  DYSLIPIDEMIA:   LDL was at 34 previously.  I am going to ask his primary care MD to draw an NMR and LP(a).  Further med  adjustments will be based on this.  HTN: Pressure is actually running low but he thinks he took 2 doses of his ARB today.  He is getting keep an eye on this because it might be running low because he weight.  He will let me know if I need to back off on that.  Current medicines are reviewed at length with the patient today.  The patient does not have concerns regarding  medicines.  The following changes have been made: None  Labs/ tests ordered today include: None  Orders Placed This Encounter  Procedures   EKG 12-Lead    Disposition:   FU with me 12 months   Signed, Minus Breeding, MD  11/16/2022 12:54 PM    [place]

## 2022-11-16 ENCOUNTER — Encounter: Payer: Self-pay | Admitting: Cardiology

## 2022-11-16 ENCOUNTER — Ambulatory Visit: Payer: 59 | Attending: Cardiology | Admitting: Cardiology

## 2022-11-16 VITALS — BP 99/65 | HR 71 | Ht 76.0 in | Wt 246.0 lb

## 2022-11-16 DIAGNOSIS — E785 Hyperlipidemia, unspecified: Secondary | ICD-10-CM

## 2022-11-16 DIAGNOSIS — I25118 Atherosclerotic heart disease of native coronary artery with other forms of angina pectoris: Secondary | ICD-10-CM | POA: Diagnosis not present

## 2022-11-16 NOTE — Patient Instructions (Signed)
  Lab Work:  Your physician recommends that you HAVE LAB WORK WITH YOUR MEDICAL DOCTOR=NMR LIPID PROFILE AND LPa  If you have labs (blood work) drawn today and your tests are completely normal, you will receive your results only by:  MyChart Message (if you have MyChart) OR A paper copy in the mail If you have any lab test that is abnormal or we need to change your treatment, we will call you to review the results.   Follow-Up: At [facility], you and your health needs are our priority.  As part of our continuing mission to provide you with exceptional heart care, we have created designated Provider Care Teams.  These Care Teams include your primary Cardiologist (physician) and Advanced Practice Providers (APPs -  Physician Assistants and Nurse Practitioners) who all work together to provide you with the care you need, when you need it.  We recommend signing up for the patient portal called "MyChart".  Sign up information is provided on this After Visit Summary.  MyChart is used to connect with patients for Virtual Visits (Telemedicine).  Patients are able to view lab/test results, encounter notes, upcoming appointments, etc.  Non-urgent messages can be sent to your provider as well.   To learn more about what you can do with MyChart, go to [email or web address].    Your next appointment:   12 month(s)  Provider:   Minus Breeding MD

## 2022-12-11 ENCOUNTER — Encounter: Payer: Self-pay | Admitting: Cardiology

## 2023-12-04 ENCOUNTER — Ambulatory Visit: Payer: 59 | Admitting: Cardiology

## 2023-12-17 ENCOUNTER — Encounter: Payer: Self-pay | Admitting: Cardiology

## 2024-01-05 DIAGNOSIS — I10 Essential (primary) hypertension: Secondary | ICD-10-CM | POA: Insufficient documentation

## 2024-01-05 NOTE — Progress Notes (Unsigned)
  Cardiology Office Note:   Date:  01/06/2024  ID:  Noah Bradley, DOB 03/06/1956, MRN [id number] PCP: Merri Brunette, MD  [facility] Providers Cardiologist:  Rollene Rotunda, MD {  History of Present Illness:   Noah Bradley is a 67 y.o. adult who presents for evaluation of exertional chest pain.  He had a calcium score of 513 in May.   POET (Plain Old Exercise Treadmill) demonstrated hypertensive response to exercise with nonspecific ST changes.  Coronary CT demonstrated two-vessel obstructive coronary disease.  There was CTO of the posterolateral branch.  This was of the RCA.  There is segmental 80% stenosis of the first OM 90% stenosis at the bifurcation of the first lateral branch.  The LAD had nonobstructive disease.  The EF was well-preserved.  Because of the chronic total occlusion of the bifurcation disease and was decided to manage medically.    Since I last saw him he has done okay.  His wife is battling multiple myeloma and he is helping her.  He goes to the gym routinely.  He does a lot of very significant activity with this. The patient denies any new symptoms such as chest discomfort, neck or arm discomfort. There has been no new shortness of breath, PND or orthopnea. There have been no reported palpitations, presyncope or syncope.   ROS: As stated in the HPI and negative for all other systems.  Studies Reviewed:    EKG:   EKG Interpretation Date/Time:  Monday January 06 2024 10:27:23 EDT Ventricular Rate:  62 PR Interval:  176 QRS Duration:  94 QT Interval:  428 QTC Calculation: 434 R Axis:   77  Text Interpretation: Normal sinus rhythm Normal ECG Confirmed by Rollene Rotunda ([id number]) on 01/06/2024 10:34:59 AM    Risk Assessment/Calculations:              Physical Exam:   VS:  BP 112/80 (BP Location: Left Arm, Patient Position: Sitting, Cuff Size: Normal)   Pulse 82   Ht 6\' 5"  (1.956 m)   Wt 254 lb 6.4 oz (115.4 kg)   SpO2 96%   BMI 30.17 kg/m    Wt  Readings from Last 3 Encounters:  01/06/24 254 lb 6.4 oz (115.4 kg)  11/16/22 246 lb (111.6 kg)  08/10/21 255 lb (115.7 kg)     GEN: Well nourished, well developed in no acute distress NECK: No JVD; No carotid bruits CARDIAC: RRR, no murmurs, rubs, gallops RESPIRATORY:  Clear to auscultation without rales, wheezing or rhonchi  ABDOMEN: Soft, non-tender, non-distended EXTREMITIES:  No edema; No deformity   ASSESSMENT AND PLAN:   OBSTRUCTIVE CAD:  The patient has no new sypmtoms.  No further cardiovascular testing is indicated.  We will continue with aggressive risk reduction and meds as listed. ted.   DYSLIPIDEMIA:   LDL was 74.  He is not at target and I am going to have him increase his Crestor to 40 mg daily and get a lipid profile in 3 months.    HTN:  The blood pressure is at target. No change in medications is indicated. We will continue with therapeutic lifestyle changes (TLC).      Follow up with me in one year.   Signed, Rollene Rotunda, MD

## 2024-01-06 ENCOUNTER — Encounter: Payer: Self-pay | Admitting: Cardiology

## 2024-01-06 ENCOUNTER — Ambulatory Visit: Payer: 59 | Attending: Cardiology | Admitting: Cardiology

## 2024-01-06 VITALS — BP 112/80 | HR 82 | Ht 77.0 in | Wt 254.4 lb

## 2024-01-06 DIAGNOSIS — I10 Essential (primary) hypertension: Secondary | ICD-10-CM

## 2024-01-06 DIAGNOSIS — E785 Hyperlipidemia, unspecified: Secondary | ICD-10-CM

## 2024-01-06 DIAGNOSIS — I2510 Atherosclerotic heart disease of native coronary artery without angina pectoris: Secondary | ICD-10-CM

## 2024-01-06 MED ORDER — ROSUVASTATIN CALCIUM 40 MG PO TABS: 40.0000 mg | ORAL_TABLET | Freq: Every day | ORAL | 3 refills | Status: AC

## 2024-01-06 NOTE — Patient Instructions (Signed)
 Medication Instructions:  INCREASE rosuvastatin to 40mg  daily  *If you need a refill on your cardiac medications before your next appointment, please call your pharmacy*  Lab Work: FASTING lab work in 3 months If you have labs (blood work) drawn today and your tests are completely normal, you will receive your results only by: [employer] (if you have MyChart) OR A paper copy in the mail If you have any lab test that is abnormal or we need to change your treatment, we will call you to review the results.  Your next appointment:   12 months with Dr. Antoine Poche      1st Floor: - Lobby - Registration  - Pharmacy  - Lab - Cafe  2nd Floor: - PV Lab - Diagnostic Testing (echo, CT, nuclear med)  3rd Floor: - Vacant  4th Floor: - TCTS (cardiothoracic surgery) - AFib Clinic - Structural Heart Clinic - Vascular Surgery  - Vascular Ultrasound  5th Floor: - [facility] (general and EP) - Clinical Pharmacy for coumadin, hypertension, lipid, weight-loss medications, and med management appointments    Valet parking services will be available as well.

## 2024-02-07 LAB — LAB REPORT - SCANNED
EGFR (Non-African Amer.): 68
PSA, Total: 1.5
TSH: 2.47 (ref 0.41–5.90)

## 2024-02-10 ENCOUNTER — Encounter: Payer: Self-pay | Admitting: *Deleted

## 2024-02-12 ENCOUNTER — Ambulatory Visit: Payer: Self-pay | Admitting: *Deleted
